# Patient Record
Sex: Male | Born: 2000 | Race: White | Hispanic: No | Marital: Single | Smoking: Never smoker
Health system: Southern US, Community
[De-identification: ages and names within clinical notes are randomized; demographics above are authoritative.]

---
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
## 2021-05-07 DIAGNOSIS — Z23 Encounter for immunization: Secondary | ICD-10-CM | POA: Diagnosis not present

## 2022-04-14 ENCOUNTER — Encounter: Payer: Self-pay | Admitting: Family Medicine

## 2022-04-15 ENCOUNTER — Ambulatory Visit: Payer: Federal, State, Local not specified - PPO | Admitting: Family Medicine

## 2022-04-15 ENCOUNTER — Encounter: Payer: Self-pay | Admitting: Family Medicine

## 2022-04-15 VITALS — BP 142/75 | HR 78 | Temp 98.8°F | Ht 71.0 in | Wt 184.6 lb

## 2022-04-15 DIAGNOSIS — Z0000 Encounter for general adult medical examination without abnormal findings: Secondary | ICD-10-CM | POA: Diagnosis not present

## 2022-04-15 DIAGNOSIS — Z23 Encounter for immunization: Secondary | ICD-10-CM

## 2022-04-15 DIAGNOSIS — L7211 Pilar cyst: Secondary | ICD-10-CM | POA: Insufficient documentation

## 2022-04-15 DIAGNOSIS — Z136 Encounter for screening for cardiovascular disorders: Secondary | ICD-10-CM | POA: Diagnosis not present

## 2022-04-15 LAB — CBC WITH DIFFERENTIAL/PLATELET
Basos: 0 %
Hematocrit: 44.5 % (ref 37.5–51.0)
Hemoglobin: 15.7 g/dL (ref 13.0–17.7)
Immature Granulocytes: 0 %
MCH: 30.1 pg (ref 26.6–33.0)
MCHC: 35.3 g/dL (ref 31.5–35.7)
Neutrophils Absolute: 3.1 10*3/uL (ref 1.4–7.0)

## 2022-04-15 LAB — URINALYSIS
Bilirubin, UA: NEGATIVE
Glucose, UA: NEGATIVE
Ketones, UA: NEGATIVE
Leukocytes,UA: NEGATIVE
Nitrite, UA: NEGATIVE
Protein,UA: NEGATIVE
RBC, UA: NEGATIVE
Specific Gravity, UA: 1.015 (ref 1.005–1.030)
Urobilinogen, Ur: 0.2 mg/dL (ref 0.2–1.0)
pH, UA: 7.5 (ref 5.0–7.5)

## 2022-04-15 LAB — CMP14+EGFR

## 2022-04-15 NOTE — Progress Notes (Signed)
Subjective:  Patient ID: William Davenport, male    DOB: [birth year]  Age: 21 y.o. MRN: [id number]  CC: Annual Exam  HPI William Davenport presents for New pt.exam.  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 No illnesses. Due updates on vaccines  Athletic. Exercises regularly. Played baseball in high school and ran cross country. Christian man. Not sexually active. Waiting for marriage. Has girlfriend at [employer]. Planning to get engagednext summer.     04/15/2022    9:21 AM  Depression screen PHQ 2/9  Decreased Interest 0  Down, Depressed, Hopeless 0  PHQ - 2 Score 0  Altered sleeping 0  Tired, decreased energy 0  Change in appetite 0  Feeling bad or failure about yourself  0  Trouble concentrating 0  Moving slowly or fidgety/restless 0  Suicidal thoughts 0  PHQ-9 Score 0  Difficult doing work/chores Not difficult at all    History William Davenport has no past medical history on file.   He has no past surgical history on file.   His family history is not on file.He reports that he has never smoked. He has never used smokeless tobacco. He reports that he does not drink alcohol and does not use drugs.    ROS Review of Systems  Constitutional:  Negative for activity change, fatigue, fever and unexpected weight change.  HENT:  Negative for congestion, ear pain, hearing loss, postnasal drip and trouble swallowing.   Eyes:  Negative for pain and visual disturbance.  Respiratory:  Negative for cough, chest tightness and shortness of breath.   Cardiovascular:  Negative for chest pain, palpitations and leg swelling.  Gastrointestinal:  Negative for abdominal distention, abdominal pain, blood in stool, constipation, diarrhea, nausea and vomiting.  Endocrine: Negative for cold intolerance, heat intolerance and polydipsia.  Genitourinary:  Negative for difficulty urinating, dysuria, flank pain, frequency and urgency.  Musculoskeletal:  Negative for arthralgias and  joint swelling.  Skin:  Negative for color change, rash and wound.  Neurological:  Negative for dizziness, syncope, speech difficulty, weakness, light-headedness, numbness and headaches.  Hematological:  Does not bruise/bleed easily.  Psychiatric/Behavioral:  Negative for confusion, decreased concentration, dysphoric mood and sleep disturbance. The patient is not nervous/anxious.     Objective:  BP (!) 142/75   Pulse 78   Temp 98.8 F (37.1 C)   Ht '5\' 11"'  (1.803 m)   Wt 184 lb 9.6 oz (83.7 kg)   SpO2 99%   BMI 25.75 kg/m   BP Readings from Last 3 Encounters:  04/15/22 (!) 142/75  09/03/17 (!) 143/88    Wt Readings from Last 3 Encounters:  04/15/22 184 lb 9.6 oz (83.7 kg)  09/03/17 187 lb 14.4 oz (85.2 kg) (96 %, Z= 1.77)*   * Growth percentiles are based on CDC (Boys, 2-20 Years) data.     Physical Exam Constitutional:      Appearance: He is well-developed.  HENT:     Head: Normocephalic and atraumatic.  Eyes:     Pupils: Pupils are equal, round, and reactive to light.  Neck:     Thyroid: No thyromegaly.     Trachea: No tracheal deviation.  Cardiovascular:     Rate and Rhythm: Normal rate and regular rhythm.     Heart sounds: Normal heart sounds. No murmur heard.    No friction rub. No gallop.  Pulmonary:     Breath sounds: Normal breath sounds. No wheezing or rales.  Abdominal:     General: Bowel  sounds are normal. There is no distension.     Palpations: Abdomen is soft. There is no mass.     Tenderness: There is no abdominal tenderness.     Hernia: There is no hernia in the left inguinal area.  Genitourinary:    Penis: Normal.      Testes: Normal.  Musculoskeletal:        General: Normal range of motion.     Cervical back: Normal range of motion.  Lymphadenopathy:     Cervical: No cervical adenopathy.  Skin:    General: Skin is warm and dry.  Neurological:     Mental Status: He is alert and oriented to person, place, and time.       Assessment &  Plan:   William Davenport was seen today for annual exam.  Diagnoses and all orders for this visit:  Well adult exam -     CBC with Differential/Platelet -     CMP14+EGFR -     Lipid panel -     Urinalysis       I have discontinued Gil Schwake's amoxicillin, ipratropium, and ibuprofen.  Allergies as of 04/15/2022       Reactions   Zithromax [azithromycin] Nausea And Vomiting        Medication List        Accurate as of April 15, 2022  9:37 AM. If you have any questions, ask your nurse or doctor.          STOP taking these medications    amoxicillin 250 MG capsule Commonly known as: AMOXIL Stopped by: Claretta Fraise, MD   ibuprofen 200 MG tablet Commonly known as: ADVIL Stopped by: Claretta Fraise, MD   ipratropium 0.06 % nasal spray Commonly known as: ATROVENT Stopped by: Claretta Fraise, MD         Follow-up: Return in about 1 year (around 04/16/2023) for Compete physical.  Claretta Fraise, M.D.

## 2022-04-15 NOTE — Addendum Note (Signed)
Addended by: Geryl Rankins D on: 04/15/2022 09:47 AM   Modules accepted: Orders

## 2022-04-16 LAB — LIPID PANEL
Chol/HDL Ratio: 3.1 ratio (ref 0.0–5.0)
Cholesterol, Total: 136 mg/dL (ref 100–199)
HDL: 44 mg/dL (ref >39)
LDL Chol Calc (NIH): 82 mg/dL (ref 0–99)
Triglycerides: 44 mg/dL (ref 0–149)
VLDL Cholesterol Cal: 10 mg/dL (ref 5–40)

## 2022-04-16 LAB — CMP14+EGFR
AST: 18 IU/L (ref 0–40)
Albumin/Globulin Ratio: 2.2 (ref 1.2–2.2)
Albumin: 5.1 g/dL (ref 4.3–5.2)
Alkaline Phosphatase: 55 IU/L (ref 51–125)
BUN/Creatinine Ratio: 11 (ref 9–20)
BUN: 9 mg/dL (ref 6–20)
Bilirubin Total: 0.4 mg/dL (ref 0.0–1.2)
Chloride: 102 mmol/L (ref 96–106)
Globulin, Total: 2.3 g/dL (ref 1.5–4.5)
Glucose: 102 mg/dL — ABNORMAL HIGH (ref 70–99)
Sodium: 142 mmol/L (ref 134–144)

## 2022-04-16 LAB — CBC WITH DIFFERENTIAL/PLATELET
Basophils Absolute: 0 10*3/uL (ref 0.0–0.2)
EOS (ABSOLUTE): 0.1 10*3/uL (ref 0.0–0.4)
Eos: 2 %
Immature Grans (Abs): 0 10*3/uL (ref 0.0–0.1)
Lymphocytes Absolute: 1.3 10*3/uL (ref 0.7–3.1)
Lymphs: 27 %
MCV: 85 fL (ref 79–97)
Monocytes Absolute: 0.4 10*3/uL (ref 0.1–0.9)
Monocytes: 7 %
Neutrophils: 64 %
Platelets: 233 10*3/uL (ref 150–450)
RBC: 5.22 x10E6/uL (ref 4.14–5.80)
RDW: 11.3 % — ABNORMAL LOW (ref 11.6–15.4)
WBC: 5 10*3/uL (ref 3.4–10.8)

## 2022-04-16 NOTE — Progress Notes (Signed)
Hello William Davenport,  Your lab result is normal and/or stable.Some minor variations that are not significant are commonly marked abnormal, but do not represent any medical problem for you.  Best regards, Claretta Fraise, M.D.

## 2022-09-15 DIAGNOSIS — L7211 Pilar cyst: Secondary | ICD-10-CM | POA: Diagnosis not present

## 2022-09-15 DIAGNOSIS — B078 Other viral warts: Secondary | ICD-10-CM | POA: Diagnosis not present
